# Patient Record
Sex: Male | Born: 1985 | Race: Black or African American | Hispanic: No | Marital: Single | Smoking: Never smoker
Health system: Southern US, Community
[De-identification: ages and names within clinical notes are randomized; demographics above are authoritative.]

---

## 2010-03-28 ENCOUNTER — Ambulatory Visit: Payer: Self-pay | Admitting: Family Medicine

## 2010-05-04 ENCOUNTER — Ambulatory Visit: Payer: Self-pay | Admitting: Family Medicine

## 2010-05-16 ENCOUNTER — Encounter: Payer: Self-pay | Admitting: Family Medicine

## 2010-06-26 ENCOUNTER — Ambulatory Visit: Payer: Self-pay | Admitting: Family Medicine

## 2011-04-23 ENCOUNTER — Ambulatory Visit: Payer: Self-pay | Admitting: Family Medicine

## 2011-06-08 ENCOUNTER — Ambulatory Visit: Payer: Self-pay | Admitting: Family Medicine

## 2012-02-06 ENCOUNTER — Ambulatory Visit: Payer: Self-pay | Admitting: Family Medicine

## 2012-02-06 LAB — URINALYSIS, COMPLETE
Bacteria: NEGATIVE
Bilirubin,UR: NEGATIVE
Blood: NEGATIVE
Glucose,UR: NEGATIVE mg/dL (ref 0–75)
Ketone: NEGATIVE
Protein: NEGATIVE
Specific Gravity: 1.015 (ref 1.003–1.030)

## 2012-02-08 LAB — URINE CULTURE

## 2013-02-22 IMAGING — CR DG FOREARM 2V*L*
2 series · 2 of 2 positions shown · non-contrast
Comparison: none

REASON FOR EXAM: Pain/ Football Injury
COMMENTS:

PROCEDURE:     DXR - DXR FOREARM LEFT  - June 08, 2011  [DATE]
RESULT:     No acute bony or joint abnormality.

[view not recorded (1 of 2)]
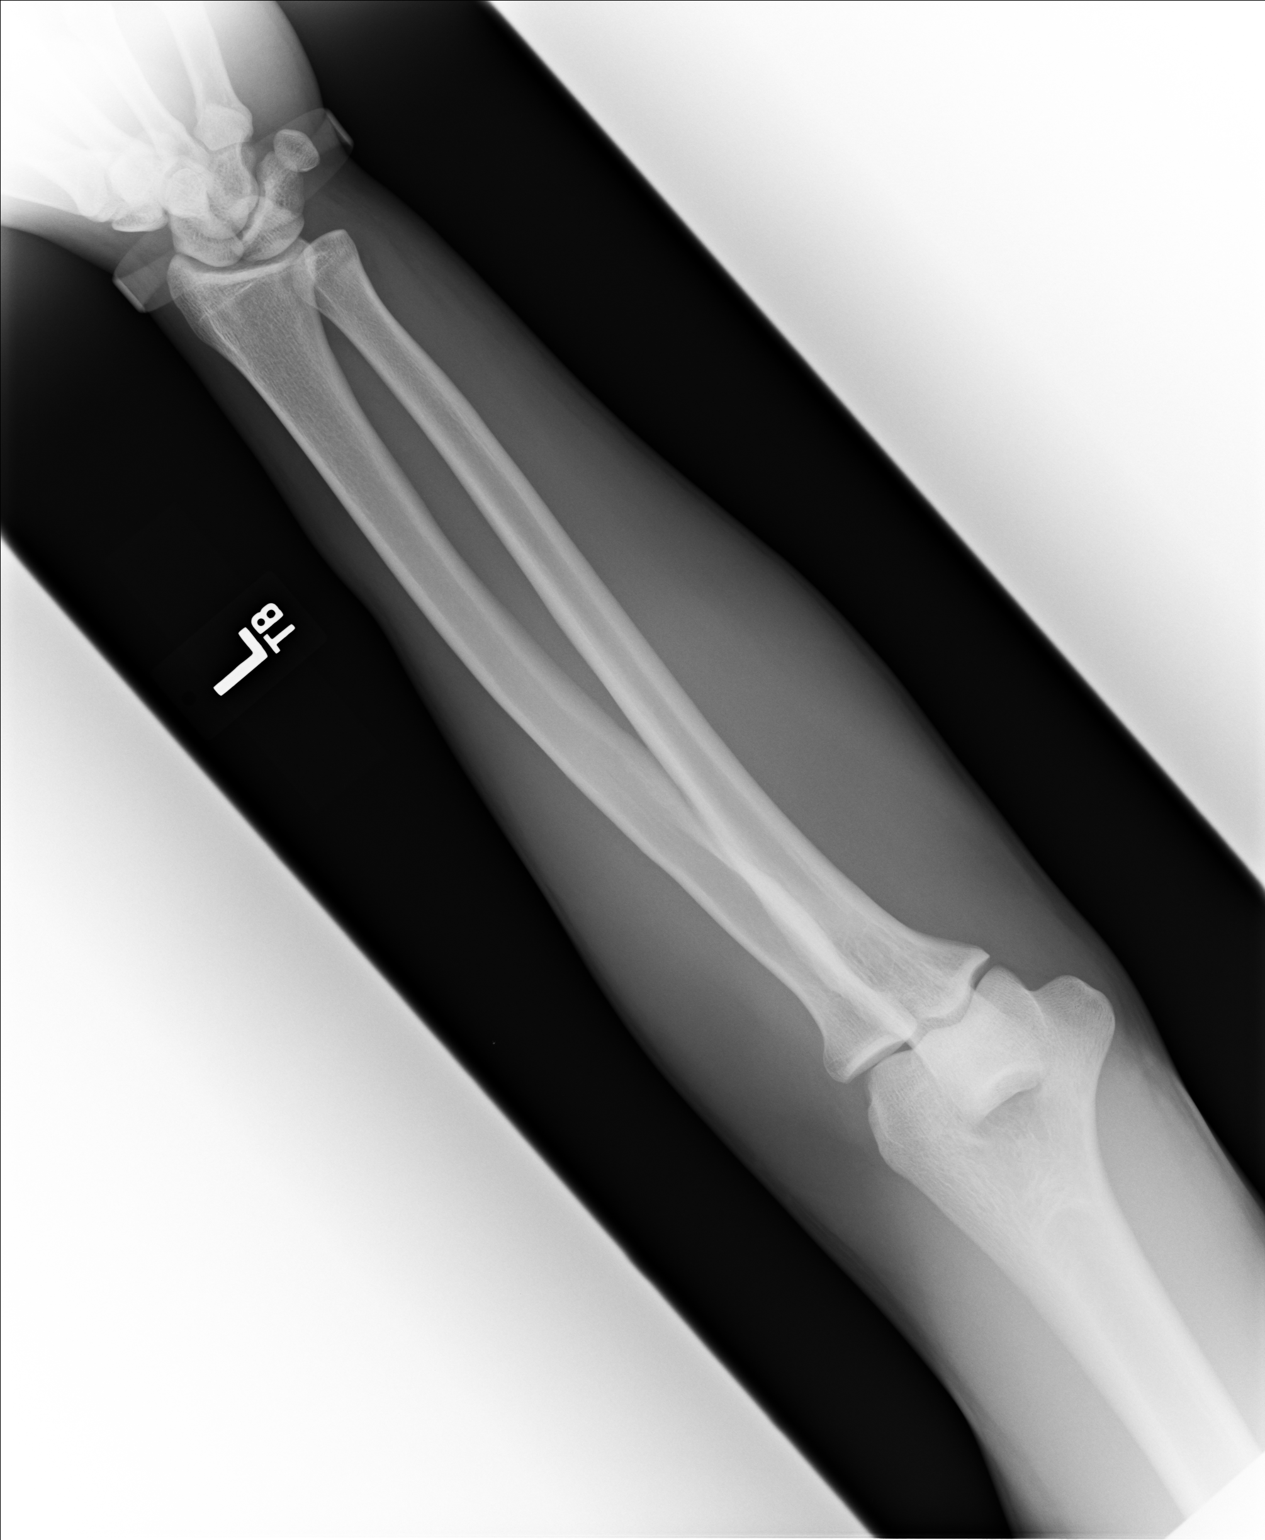

[view not recorded (2 of 2)]
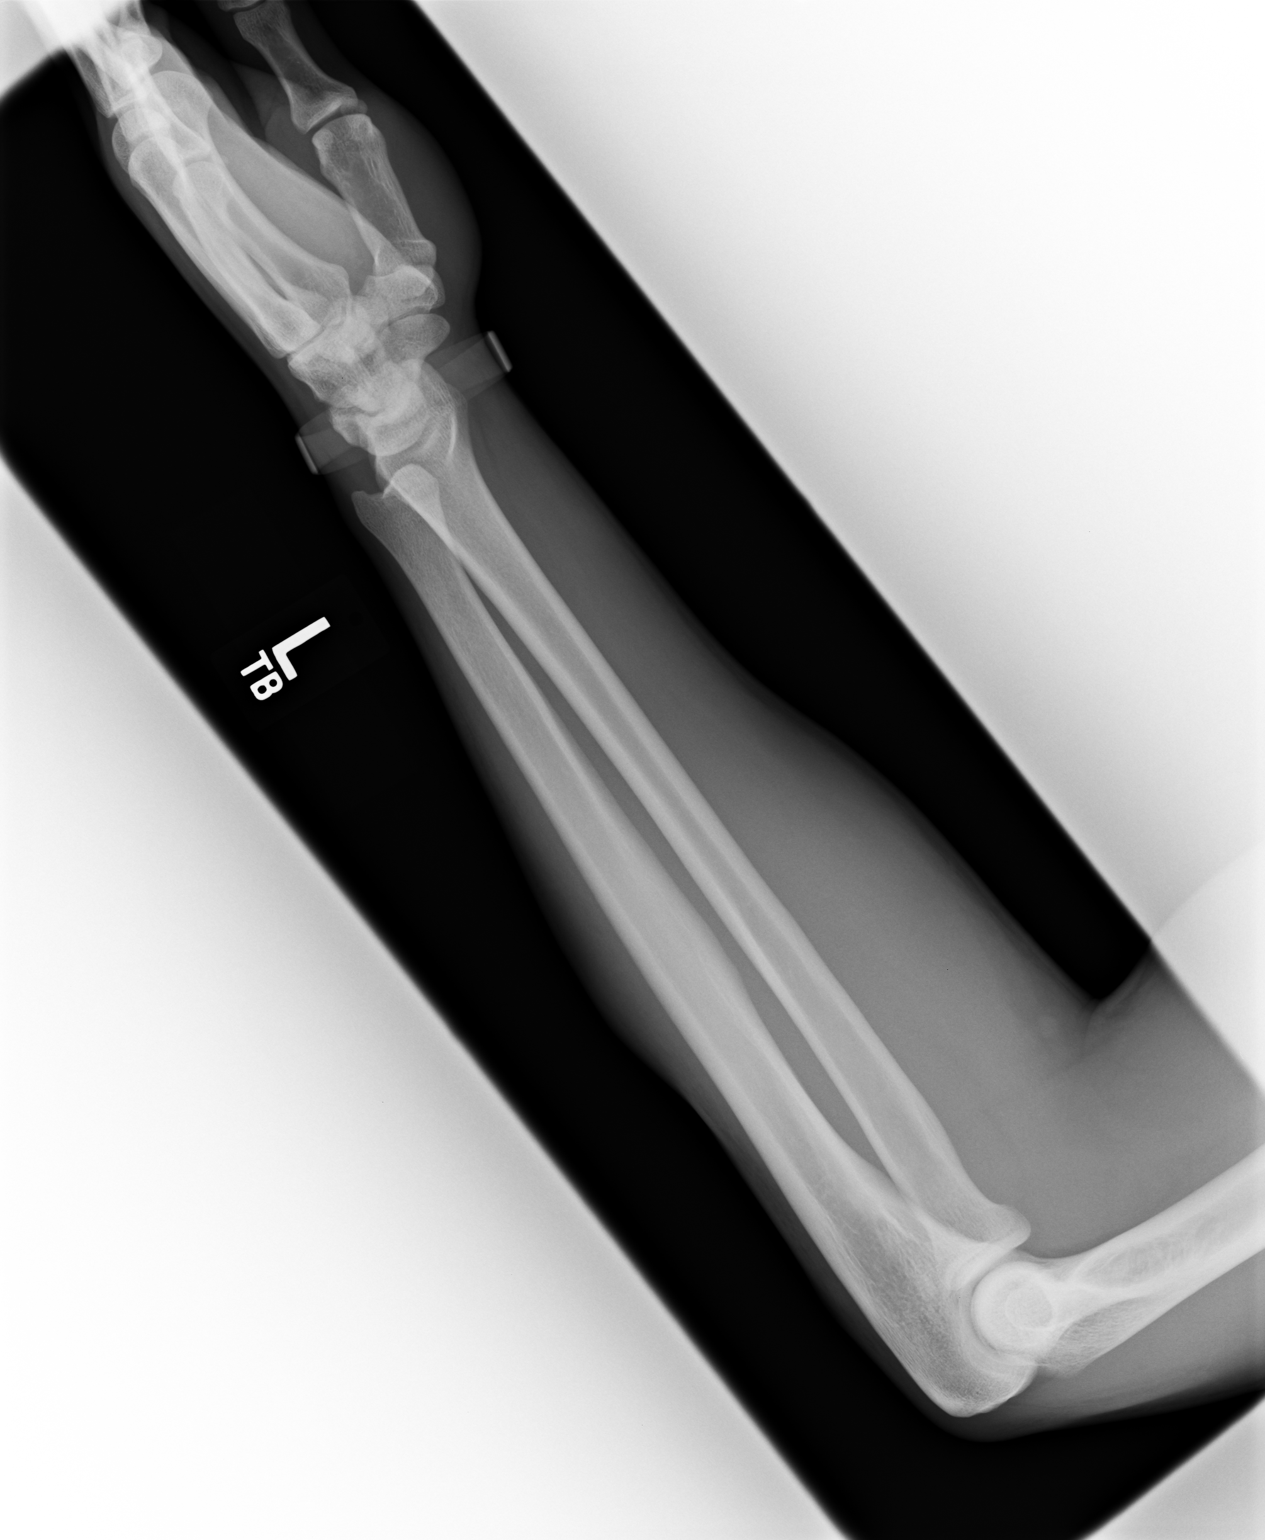

[2 of 2 positions shown; findings below may reference images not displayed]

IMPRESSION: No acute abnormality.

## 2021-02-28 ENCOUNTER — Ambulatory Visit: Payer: Self-pay | Admitting: Medical

## 2021-02-28 ENCOUNTER — Telehealth: Payer: Self-pay | Admitting: Medical

## 2021-02-28 ENCOUNTER — Other Ambulatory Visit: Payer: Self-pay

## 2021-02-28 ENCOUNTER — Encounter: Payer: Self-pay | Admitting: Medical

## 2021-02-28 VITALS — BP 118/68 | HR 60 | Temp 98.1°F | Resp 16 | Ht 76.5 in | Wt 216.8 lb

## 2021-02-28 DIAGNOSIS — S058X2A Other injuries of left eye and orbit, initial encounter: Secondary | ICD-10-CM

## 2021-02-28 MED ORDER — ERYTHROMYCIN 5 MG/GM OP OINT
TOPICAL_OINTMENT | OPHTHALMIC | 0 refills | Status: DC
Start: 2021-02-28 — End: 2021-03-05

## 2021-02-28 NOTE — Progress Notes (Signed)
   Subjective:    Patient ID: Vincent Carroll, male    DOB: [birth year], 35 y.o.   MRN: [id number]  HPI 35 yo male in non acute distress. [place] the end of the day his left eye was painful, removed contact once he was home. Left eye itchy on Monday, worsening erythema of the sclera.  No vision changes, blurry due to no contacts in left eye.    Given  eye drops by trainer at work, he thinks it was something like visine.  No contacts today in left eye and he lost his glasses.  Blood pressure 118/68, pulse 60, temperature 98.1 F (36.7 C), temperature source Oral, resp. rate 16, height 6' 4.5" (1.943 m), weight 216 lb 12.8 oz (98.3 kg), SpO2 98 %.   No Known Allergies  Review of Systems  Constitutional:  Positive for chills and fever.  HENT:  Positive for rhinorrhea. Negative for sore throat.   Eyes:  Positive for photophobia, redness and visual disturbance. Negative for pain, discharge and itching.       Tearing, painful  3/10 annoying  Respiratory:  Negative for cough.   Cardiovascular:  Negative for chest pain.  Gastrointestinal:  Negative for abdominal pain and diarrhea.  Musculoskeletal:  Negative for myalgias.  Skin:  Negative for rash.  Neurological:  Negative for dizziness, light-headedness and headaches.      Objective:   Physical Exam Eyes:     General: Lids are normal. Vision grossly intact. Gaze aligned appropriately.        Left eye: No discharge.     Conjunctiva/sclera:     Left eye: Left conjunctiva is injected.     Pupils: Pupils are equal, round, and reactive to light.     Left eye: Corneal abrasion present.     Slit lamp exam:    Left eye: Photophobia present. No corneal ulcer.      Comments: Corneal abrasion on the edge of pupil in the pattern of the contact    No visual acuity performed due to patient without his contact in the left eye. He does not have a pair of glasses.      Assessment & Plan:  Eye Abrasion  left [facility] in [place] is where  his eye MD  is located. Reviewed hygiene with patient. No contact in left eye. Return on Monday for a recheck. Does not have  a pair of glasses. If worsening to return to clinic by Friday. Patient verbalizes understanding and has no questions at discharge.

## 2021-02-28 NOTE — Telephone Encounter (Signed)
Called pharmacy , they did have the prescription, it was not ready when he asked for it and he thought that the prescription was not sent to the pharmacy.   When I called they had it ready for him to pick up.

## 2021-02-28 NOTE — Patient Instructions (Signed)
Corneal Abrasion A corneal abrasion is a scratch or injury to the clear covering over the front of your eye (cornea). This can be painful. It is important to get treatment for a corneal abrasion. If this problem is not treated, it can affect your eyesight (vision). What are the causes? A poke in the eye. An object in the eye. Too much eye rubbing. Very dry eyes. Certain eye infections. Contact lenses that do not fit right or are worn for too long. You can also injure your cornea when putting contact lenses in your eye or taking them out. Eye surgery. Certain cornea problems may increase the chance of a corneal abrasion. Sometimes, the cause is not known. What are the signs or symptoms? Eye pain. The pain may get worse when you open and close your eye or when you move your eye. A feeling of something stuck in your eye. Tearing, redness, and sensitivity to light. Having trouble keeping your eye open, or not being able to keep it open. Blurred vision. Headache. How is this diagnosed? You may work with a health care provider who specializes in conditions of the eye (ophthalmologist). This condition may be diagnosed based on your medical history, symptoms, and an eye exam. How is this treated? Washing out your eye. Removing anything that is stuck in your eye. Using antibiotic drops or ointment to treat or prevent an infection. Using a dilating drop to decrease irritation, swelling, and pain. Using steroid drops or ointment to treat redness, irritation, or swelling. Applying a cold, wet cloth (cold compress) or ice pack to ease the pain Taking pain medicine by mouth. In some cases, an eye patch or bandage soft contact lens might also be used. An eye patch should not be used if the corneal abrasion was related to contact lens wear as it can increase the chance of infection in these eyes. Follow these instructions at home: Medicines Use eye drops or ointments as told by your doctor. If you  were prescribed antibiotic drops or ointment, use them as told by your doctor. Do not stop using the antibiotic even if you start to feel better. Take over-the-counter and prescription medicines only as told by your doctor. Ask your doctor if the medicine prescribed to you: Requires you to avoid driving or using heavy machinery. Can cause trouble pooping (constipation). You may need to take these actions to prevent or treat trouble pooping: Drink enough fluid to keep your pee (urine) pale yellow. Take over-the-counter or prescription medicines. Eat foods that are high in fiber. These include beans, whole grains, and fresh fruits and vegetables. Limit foods that are high in fat and processed sugars. These include fried or sweet foods. Using an eye patch If you have an eye patch, wear it as told by your doctor. Do not drive or use machinery while wearing an eye patch. Follow instructions from your doctor about when to take off the patch. General instructions Ask your doctor if you can use a cold, wet cloth on your eye to help with pain. Do not rub or touch your eye. Do not wash out your eye. Do not wear contact lenses until your doctor says that this is okay. Avoid bright light. Avoid straining your eyes. Keep all follow-up visits as told by your doctor. Doing this can help to prevent infection and loss of eyesight. Contact a doctor if: You keep having eye pain and other symptoms for more than 2 days. You get new symptoms, such as more redness,   eyes, or discharge. You have discharge that makes your eyelids stick together in the morning. Your eye patch becomes so loose that you can blink your eye. Symptoms come back after your eye heals. Get help right away if: You have very bad eye pain that does not get better with medicine. You lose eyesight. Summary A corneal abrasion is a scratch or injury to the clear covering over the front of the eye (cornea). It is important to get  treatment for a corneal abrasion. If this problem is not treated, it can affect your eyesight (vision). Use eye drops or ointments as told by your doctor. If you have an eye patch, do not drive or use machinery while wearing it. Let your doctor know if your symptoms last for more than 2 days. This information is not intended to replace advice given to you by your health care provider. Make sure you discuss any questions you have with your healthcare provider. Document Revised: 12/04/2018 Document Reviewed: 12/04/2018 Elsevier Patient Education  2022 [employer].

## 2021-03-05 ENCOUNTER — Encounter: Payer: Self-pay | Admitting: Medical

## 2021-03-05 ENCOUNTER — Other Ambulatory Visit: Payer: Self-pay

## 2021-03-05 ENCOUNTER — Ambulatory Visit: Payer: Self-pay | Admitting: Medical

## 2021-03-05 DIAGNOSIS — S058X2A Other injuries of left eye and orbit, initial encounter: Secondary | ICD-10-CM

## 2021-03-05 MED ORDER — ERYTHROMYCIN 5 MG/GM OP OINT: TOPICAL_OINTMENT | OPHTHALMIC | 0 refills | Status: DC

## 2021-03-05 NOTE — Patient Instructions (Signed)
Pupil abrasion A corneal abrasion is a scratch or injury to the clear covering over the front of your eye (cornea). This can be painful. It is important to get treatment for a corneal abrasion. If this problem is not treated, it can affect your eyesight (vision). What are the causes? A poke in the eye. An object in the eye. Too much eye rubbing. Very dry eyes. Certain eye infections. Contact lenses that do not fit right or are worn for too long. You can also injure your cornea when putting contact lenses in your eye or taking them out. Eye surgery. Certain cornea problems may increase the chance of a corneal abrasion. Sometimes, the cause is not known. What are the signs or symptoms? Eye pain. The pain may get worse when you open and close your eye or when you move your eye. A feeling of something stuck in your eye. Tearing, redness, and sensitivity to light. Having trouble keeping your eye open, or not being able to keep it open. Blurred vision. Headache. How is this diagnosed? You may work with a health care provider who specializes in conditions of the eye (ophthalmologist). This condition may be diagnosed based on your medical history, symptoms, andan eye exam. How is this treated? Washing out your eye. Removing anything that is stuck in your eye. Using antibiotic drops or ointment to treat or prevent an infection. Using a dilating drop to decrease irritation, swelling, and pain. Using steroid drops or ointment to treat redness, irritation, or swelling. Applying a cold, wet cloth (cold compress) or ice pack to ease the pain Taking pain medicine by mouth. In some cases, an eye patch or bandage soft contact lens might also be used. An eye patch should not be used if the corneal abrasion was related to contactlens wear as it can increase the chance of infection in these eyes. Follow these instructions at home: Medicines Use eye drops or ointments as told by your doctor. If you were  prescribed antibiotic drops or ointment, use them as told by your doctor. Do not stop using the antibiotic even if you start to feel better. Take over-the-counter and prescription medicines only as told by your doctor. Ask your doctor if the medicine prescribed to you: Requires you to avoid driving or using heavy machinery. Can cause trouble pooping (constipation). You may need to take these actions to prevent or treat trouble pooping: Drink enough fluid to keep your pee (urine) pale yellow. Take over-the-counter or prescription medicines. Eat foods that are high in fiber. These include beans, whole grains, and fresh fruits and vegetables. Limit foods that are high in fat and processed sugars. These include fried or sweet foods. Using an eye patch If you have an eye patch, wear it as told by your doctor. Do not drive or use machinery while wearing an eye patch. Follow instructions from your doctor about when to take off the patch. General instructions Ask your doctor if you can use a cold, wet cloth on your eye to help with pain. Do not rub or touch your eye. Do not wash out your eye. Do not wear contact lenses until your doctor says that this is okay. Avoid bright light. Avoid straining your eyes. Keep all follow-up visits as told by your doctor. Doing this can help to prevent infection and loss of eyesight. Contact a doctor if: You keep having eye pain and other symptoms for more than 2 days. You get new symptoms, such as more redness, watery eyes,  or discharge. You have discharge that makes your eyelids stick together in the morning. Your eye patch becomes so loose that you can blink your eye. Symptoms come back after your eye heals. Get help right away if: You have very bad eye pain that does not get better with medicine. You lose eyesight. Summary A corneal abrasion is a scratch or injury to the clear covering over the front of the eye (cornea). It is important to get treatment  for a corneal abrasion. If this problem is not treated, it can affect your eyesight (vision). Use eye drops or ointments as told by your doctor. If you have an eye patch, do not drive or use machinery while wearing it. Let your doctor know if your symptoms last for more than 2 days. This information is not intended to replace advice given to you by your health care provider. Make sure you discuss any questions you have with your healthcare provider. Document Revised: 12/04/2018 Document Reviewed: 12/04/2018 Elsevier Patient Education  2022 [employer].

## 2021-03-05 NOTE — Progress Notes (Signed)
   Subjective:    Patient ID: Vincent Carroll, male    DOB: 02-15-1986, 35 y.o.   MRN: [id number]  HPI 35 yo male in non acute distress here for follow up appointment. [place] Has some pain then , the the next day the pain increasingly got worse.  Seen on 02/28/2021 and prescribed Erythromycin ophthalmic ointment. He states it feels much better and would like to restart wearing his contact in the eye. He has not followed up with his eye doctor yet. ( As  previously discussed).   Blood pressure 130/72, pulse 62, temperature 98.2 F (36.8 C), temperature source Oral, resp. rate 16, SpO2 97 %.  No Known Allergies    Review of Systems  Eyes:  Positive for redness (mild). Negative for photophobia, pain, discharge, itching and visual disturbance.      Objective:   Physical Exam Constitutional:      Appearance: Normal appearance.  HENT:     Head: Normocephalic.  Eyes:     General: Lids are normal. Vision grossly intact. Gaze aligned appropriately. No scleral icterus.       Right eye: No discharge.        Left eye: No discharge.     Extraocular Movements: Extraocular movements intact.     Conjunctiva/sclera:     Left eye: Left conjunctiva is injected (mild).     Pupils: Pupils are equal, round, and reactive to light.   Musculoskeletal:     Cervical back: Normal range of motion and neck supple.  Skin:    General: Skin is warm and dry.  Neurological:     General: No focal deficit present.     Mental Status: He is alert and oriented to person, place, and time.  Psychiatric:        Mood and Affect: Mood normal.        Behavior: Behavior normal.        Thought Content: Thought content normal.        Judgment: Judgment normal.    Fluroscein stain uptake noted on pupil , much improved.      Assessment & Plan:  Left  Pupil abrasion Continue Erythromycin ophthalmic ointment.follow up in one week, no use of  left contact ,till eye is completely  healed. He states he will make a follow up appointment this week with his eye doctor. Patient verbalizes understanding and has no questions at discharge.

## 2022-09-18 ENCOUNTER — Ambulatory Visit (INDEPENDENT_AMBULATORY_CARE_PROVIDER_SITE_OTHER): Payer: Self-pay | Admitting: Adult Health

## 2022-09-18 ENCOUNTER — Encounter: Payer: Self-pay | Admitting: Adult Health

## 2022-09-18 VITALS — BP 132/72 | HR 65 | Temp 98.2°F

## 2022-09-18 DIAGNOSIS — M79674 Pain in right toe(s): Secondary | ICD-10-CM

## 2022-09-18 DIAGNOSIS — B353 Tinea pedis: Secondary | ICD-10-CM

## 2022-09-18 MED ORDER — TERBINAFINE HCL 250 MG PO TABS: 250.0000 mg | ORAL_TABLET | Freq: Every day | ORAL | 2 refills | Status: DC

## 2022-09-18 NOTE — Progress Notes (Signed)
[facility] [address], [place] [address]   Office Visit Note  Patient Name: Vincent Carroll Date of Birth [id number]  Medical Record number [id number]  Date of Service: 09/18/2022  Chief Complaint  Patient presents with   Toe Pain    Started 7am this morning. Was fine until this morning. Right big toe pain and swollen under bottom.  Nail fungus?      HPI Pt is here for a sick visit. He reports waking up this morning with Right great toe pain.  He noticed it when he got up to walk to the bathroom.  He describes it as a sharp pain.  He Played basketball a few days ago, so isn't sure if that caused a problem.  He also repots some ongoing toenail fungus on his right great toe.    Current Medication:  Outpatient Encounter Medications as of 09/18/2022  Medication Sig   terbinafine (LAMISIL) 250 MG tablet Take 1 tablet (250 mg total) by mouth daily.   erythromycin ophthalmic ointment Appply  1/2 inch ribbon to lower lid  three times a day, Blot do not rub excess on eye ater applying ointment. (Patient not taking: Reported on 09/18/2022)   No facility-administered encounter medications on file as of 09/18/2022.      Medical History: No past medical history on file.   Vital Signs: BP 132/72 (BP Location: Left Arm, Patient Position: Sitting, Cuff Size: Normal)   Pulse 65   Temp 98.2 F (36.8 C) (Tympanic)   SpO2 98%    Review of Systems  Constitutional:  Negative for chills, fatigue and fever.  Musculoskeletal:        Toe pain    Physical Exam Vitals and nursing note reviewed.  Constitutional:      Appearance: Normal appearance.  Musculoskeletal:     Comments: Right great toe, point tenderness on medial aspect at distal portion.  Joints do not appear sensitive, or swollen.  No significant swelling, warmth or redness noted.   Neurological:     Mental Status: He is alert.     Assessment/Plan: 1. Toe pain, right Avoid any activities that stress toe.   Take Motrin 600mg  every 6-8 hours.  Ice or heat, whichever feels better. Follow up in clinic if symptoms continue.  2. Tinea pedis of right foot Take Terbinafine as prescribed.  - terbinafine (LAMISIL) 250 MG tablet; Take 1 tablet (250 mg total) by mouth daily.  Dispense: 30 tablet; Refill: 2     General Counseling: [name] verbalizes understanding of the findings of todays visit and agrees with plan of treatment. I have discussed any further diagnostic evaluation that may be needed or ordered today. We also reviewed his medications today. he has been encouraged to call the office with any questions or concerns that should arise related to todays visit.   No orders of the defined types were placed in this encounter.   Meds ordered this encounter  Medications   terbinafine (LAMISIL) 250 MG tablet    Sig: Take 1 tablet (250 mg total) by mouth daily.    Dispense:  30 tablet    Refill:  2    Time spent:20 Minutes    Kendell Bane AGNP-C Nurse Practitioner

## 2023-02-12 ENCOUNTER — Encounter: Payer: Self-pay | Admitting: Physician Assistant

## 2023-02-12 ENCOUNTER — Ambulatory Visit (INDEPENDENT_AMBULATORY_CARE_PROVIDER_SITE_OTHER): Payer: Self-pay | Admitting: Physician Assistant

## 2023-02-12 ENCOUNTER — Other Ambulatory Visit: Payer: Self-pay

## 2023-02-12 VITALS — BP 121/81 | HR 50 | Temp 96.4°F

## 2023-02-12 DIAGNOSIS — L738 Other specified follicular disorders: Secondary | ICD-10-CM

## 2023-02-12 DIAGNOSIS — L209 Atopic dermatitis, unspecified: Secondary | ICD-10-CM

## 2023-02-12 MED ORDER — CEPHALEXIN 500 MG PO CAPS: 500.0000 mg | ORAL_CAPSULE | Freq: Four times a day (QID) | ORAL | 0 refills | Status: DC

## 2023-02-12 MED ORDER — KETOCONAZOLE 2 % EX SHAM: 1.0000 | MEDICATED_SHAMPOO | CUTANEOUS | 1 refills | Status: AC

## 2023-02-12 MED ORDER — CLINDAMYCIN PHOSPHATE 1 % EX GEL: Freq: Two times a day (BID) | CUTANEOUS | 2 refills | Status: AC

## 2023-02-12 NOTE — Progress Notes (Signed)
[facility] [address] [place], [place] [address]   Office Visit Note  Patient Name: Vincent Carroll Date of Birth [id number]  Medical Record number [id number]  Date of Service: 02/12/2023  Chief Complaint  Patient presents with   Rash    Mainly only scalp, states that he has "flares" from hair cuts but this flare is lasting longer than normal      37 y/o M presents to the clinic for "bumps" on the scalp x many years, but worse over the past 2-3 months. Noticed some bleeding with scratching his scalp. Haircuts every 2 weeks.   Rash      Current Medication:  Outpatient Encounter Medications as of 02/12/2023  Medication Sig   cephALEXin (KEFLEX) 500 MG capsule Take 1 capsule (500 mg total) by mouth 4 (four) times daily.   clindamycin (CLINDAGEL) 1 % gel Apply topically 2 (two) times daily.   [START ON 02/13/2023] ketoconazole (NIZORAL) 2 % shampoo Apply 1 Application topically 2 (two) times a week.   [DISCONTINUED] erythromycin ophthalmic ointment Appply  1/2 inch ribbon to lower lid  three times a day, Blot do not rub excess on eye ater applying ointment. (Patient not taking: Reported on 09/18/2022)   [DISCONTINUED] terbinafine (LAMISIL) 250 MG tablet Take 1 tablet (250 mg total) by mouth daily.   No facility-administered encounter medications on file as of 02/12/2023.      Medical History: History reviewed. No pertinent past medical history.   Vital Signs: BP 121/81   Pulse (!) 50   Temp (!) 96.4 F (35.8 C) (Tympanic)   SpO2 98%    Review of Systems  Constitutional: Negative.   Skin:  Positive for rash.    Physical Exam Constitutional:      Appearance: Normal appearance.  HENT:     Head: Normocephalic and atraumatic.     Right Ear: External ear normal.     Left Ear: External ear normal.  Eyes:     Extraocular Movements: Extraocular movements intact.  Skin:    General: Skin is warm and dry.     Comments: Parietal Scalp: +multiple raised  maculopapular vesicular/pustular lesions with some bleeding.   Neurological:     Mental Status: He is alert and oriented to person, place, and time.  Psychiatric:        Mood and Affect: Mood normal.        Behavior: Behavior normal.       Assessment/Plan:  1. Pseudofolliculitis - clindamycin (CLINDAGEL) 1 % gel; Apply topically 2 (two) times daily.  Dispense: 30 g; Refill: 2 - cephALEXin (KEFLEX) 500 MG capsule; Take 1 capsule (500 mg total) by mouth 4 (four) times daily.  Dispense: 40 capsule; Refill: 0  2. Atopic dermatitis of scalp - ketoconazole (NIZORAL) 2 % shampoo; Apply 1 Application topically 2 (two) times a week.  Dispense: 120 mL; Refill: 1  Use shampoo as prescribed. Apply topical antibiotic gel as prescribed. Take oral antibiotic as prescribed. Continue to watch for worsening symptoms. Haircut every 4-6 weeks. Pt verbalized understanding and in agreement.  RTC in 2-3 weeks or sooner if any difficulty arises.   General Counseling: Samuele verbalizes understanding of the findings of todays visit and agrees with plan of treatment. I have discussed any further diagnostic evaluation that may be needed or ordered today. We also reviewed his medications today. he has been encouraged to call the office with any questions or concerns that should arise related to todays visit.  Time spent:20 [place], [place] Physician Assistant

## 2023-09-17 ENCOUNTER — Encounter: Payer: Self-pay | Admitting: Adult Health

## 2023-09-17 ENCOUNTER — Ambulatory Visit (INDEPENDENT_AMBULATORY_CARE_PROVIDER_SITE_OTHER): Payer: Self-pay | Admitting: Adult Health

## 2023-09-17 ENCOUNTER — Other Ambulatory Visit: Payer: Self-pay

## 2023-09-17 VITALS — BP 110/80 | HR 56 | Temp 95.9°F | Ht 75.0 in | Wt 218.0 lb

## 2023-09-17 DIAGNOSIS — R051 Acute cough: Secondary | ICD-10-CM

## 2023-09-17 MED ORDER — ALBUTEROL SULFATE HFA 108 (90 BASE) MCG/ACT IN AERS: 2.0000 | INHALATION_SPRAY | Freq: Four times a day (QID) | RESPIRATORY_TRACT | 0 refills | Status: AC | PRN

## 2023-09-17 NOTE — Progress Notes (Signed)
 [facility] [address] [place], [place] [address]   Office Visit Note  Patient Name: Vincent Carroll Date of Birth [id number]  Medical Record number [id number]  Date of Service: 09/17/2023  Chief Complaint  Patient presents with   Sick visit    Patient c/o cough with clear-yellow, chest tightness, and a stinging sensation in his chest when he coughs.  He has also had rhinorrhea. Current symptoms began about 2 days ago but he states he has been sick on and off for he past few weeks. He took Robitussin DM 2 days ago.      HPI Pt is here for a sick visit. He was sick last month, it lasted 3-4 days.  Last week he started having cough, and this week he was been having productivity and runny nose. He reports coughing mostly during the day.  It does not wake him up. He had some slight headache.  Denies ear pain, fever and chills.    Current Medication:  Outpatient Encounter Medications as of 09/17/2023  Medication Sig   albuterol  (VENTOLIN  HFA) 108 (90 Base) MCG/ACT inhaler Inhale 2 puffs into the lungs every 6 (six) hours as needed for wheezing or shortness of breath.   ketoconazole  (NIZORAL ) 2 % shampoo Apply 1 Application topically 2 (two) times a week.   clindamycin  (CLINDAGEL) 1 % gel Apply topically 2 (two) times daily. (Patient taking differently: Apply topically 2 (two) times daily as needed.)   [DISCONTINUED] cephALEXin  (KEFLEX ) 500 MG capsule Take 1 capsule (500 mg total) by mouth 4 (four) times daily.   No facility-administered encounter medications on file as of 09/17/2023.      Medical History: History reviewed. No pertinent past medical history.   Vital Signs: BP 110/80   Pulse (!) 56   Temp (!) 95.9 F (35.5 C)   Ht 6' 3 (1.905 m)   Wt 218 lb (98.9 kg)   SpO2 99%   BMI 27.25 kg/m    Review of Systems  Constitutional:  Negative for chills, fatigue and fever.  Respiratory:  Positive for cough.     Physical Exam Vitals reviewed.  Constitutional:       Appearance: Normal appearance.  HENT:     Head: Normocephalic.     Right Ear: Tympanic membrane and ear canal normal.     Left Ear: Tympanic membrane and ear canal normal.     Nose: Nose normal.     Mouth/Throat:     Mouth: Mucous membranes are moist.     Pharynx: No posterior oropharyngeal erythema.  Eyes:     Pupils: Pupils are equal, round, and reactive to light.  Cardiovascular:     Rate and Rhythm: Normal rate.  Pulmonary:     Effort: Pulmonary effort is normal.     Breath sounds: Normal breath sounds.  Lymphadenopathy:     Cervical: No cervical adenopathy.  Neurological:     Mental Status: He is alert.    Assessment/Plan: 1. Acute cough (Primary) Use albuterol  as discussed. Follow up via MyChart messenger if symptoms fail to improve or may return to clinic as needed for worsening symptoms.   - albuterol  (VENTOLIN  HFA) 108 (90 Base) MCG/ACT inhaler; Inhale 2 puffs into the lungs every 6 (six) hours as needed for wheezing or shortness of breath.  Dispense: 8.5 g; Refill: 0     General Counseling: [name] verbalizes understanding of the findings of todays visit and agrees with plan of treatment. I have discussed any further  diagnostic evaluation that may be needed or ordered today. We also reviewed his medications today. he has been encouraged to call the office with any questions or concerns that should arise related to todays visit.   No orders of the defined types were placed in this encounter.   Meds ordered this encounter  Medications   albuterol  (VENTOLIN  HFA) 108 (90 Base) MCG/ACT inhaler    Sig: Inhale 2 puffs into the lungs every 6 (six) hours as needed for wheezing or shortness of breath.    Dispense:  8.5 g    Refill:  0    Time spent:20 Minutes    Juliene DOROTHA Howells AGNP-C Nurse Practitioner
